# Patient Record
Sex: Male | Born: 1995 | Race: White | Hispanic: No | Marital: Single | Smoking: Never smoker
Health system: Southern US, Community
[De-identification: ages and names within clinical notes are randomized; demographics above are authoritative.]

## PROBLEM LIST (undated history)

## (undated) ENCOUNTER — Emergency Department (HOSPITAL_COMMUNITY): Admission: EM | Payer: Self-pay | Source: Home / Self Care

---
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
## 2013-06-14 ENCOUNTER — Emergency Department (HOSPITAL_COMMUNITY)
Admission: EM | Admit: 2013-06-14 | Discharge: 2013-06-14 | Disposition: A | Discharge status: Home / Self Care | Payer: Medicaid Other | Attending: Emergency Medicine | Admitting: Emergency Medicine

## 2013-06-14 ENCOUNTER — Encounter (HOSPITAL_COMMUNITY): Payer: Self-pay | Admitting: Emergency Medicine

## 2013-06-14 DIAGNOSIS — IMO0002 Reserved for concepts with insufficient information to code with codable children: Secondary | ICD-10-CM

## 2013-06-14 DIAGNOSIS — L600 Ingrowing nail: Secondary | ICD-10-CM

## 2013-06-14 DIAGNOSIS — L03039 Cellulitis of unspecified toe: Secondary | ICD-10-CM | POA: Insufficient documentation

## 2013-06-14 MED ORDER — OXYCODONE-ACETAMINOPHEN 5-325 MG PO TABS: 1.0000 | ORAL_TABLET | ORAL | Status: DC | PRN

## 2013-06-14 MED ORDER — IBUPROFEN 600 MG PO TABS: 600.0000 mg | ORAL_TABLET | Freq: Four times a day (QID) | ORAL | Status: AC | PRN

## 2013-06-14 MED ORDER — SULFAMETHOXAZOLE-TRIMETHOPRIM 800-160 MG PO TABS: 1.0000 | ORAL_TABLET | Freq: Two times a day (BID) | ORAL | Status: DC

## 2013-06-14 NOTE — ED Provider Notes (Signed)
CSN: [id number]     Arrival date & time 06/14/13  1523 History   First MD Initiated Contact with Patient 06/14/13 1527     Chief Complaint  Patient presents with  . Nail Problem     (Consider location/radiation/quality/duration/timing/severity/associated sxs/prior Treatment) HPI  17yM with pain in R great toe. Ingrown toenail. Has been picking at it. Increasing pain, redness and small amount of pus draining. No fever or chills. Not a diabetic. No numbness or tingling.   History reviewed. No pertinent past medical history. History reviewed. No pertinent past surgical history. No family history on file. History  Substance Use Topics  . Smoking status: Never Smoker   . Smokeless tobacco: Not on file  . Alcohol Use: No    Review of Systems  All systems reviewed and negative, other than as noted in HPI.   Allergies  Review of patient's allergies indicates no known allergies.  Home Medications   Prior to Admission medications   Medication Sig Start Date End Date Taking? Authorizing Provider  acetaminophen (TYLENOL) 325 MG tablet Take 325 mg by mouth every 6 (six) hours as needed for mild pain.   Yes Historical Provider, MD  ibuprofen (ADVIL,MOTRIN) 600 MG tablet Take 1 tablet (600 mg total) by mouth every 6 (six) hours as needed. 06/14/13   Raeford Razor, MD  oxyCODONE-acetaminophen (PERCOCET/ROXICET) 5-325 MG per tablet Take 1-2 tablets by mouth every 4 (four) hours as needed for severe pain. 06/14/13   Raeford Razor, MD  sulfamethoxazole-trimethoprim (SEPTRA DS) 800-160 MG per tablet Take 1 tablet by mouth every 12 (twelve) hours. 06/14/13   Raeford Razor, MD   BP 122/69  Pulse 64  Temp(Src) 97.9 F (36.6 C) (Oral)  Resp 19  SpO2 99% Physical Exam  Nursing note and vitals reviewed. Constitutional: He appears well-developed and well-nourished. No distress.  HENT:  Head: Normocephalic and atraumatic.  Eyes: Conjunctivae are normal. Right eye exhibits no discharge. Left eye  exhibits no discharge.  Neck: Neck supple.  Cardiovascular: Normal rate, regular rhythm and normal heart sounds.  Exam reveals no gallop and no friction rub.   No murmur heard. Pulmonary/Chest: Effort normal and breath sounds normal. No respiratory distress.  Abdominal: Soft. He exhibits no distension. There is no tenderness.  Musculoskeletal:  R big toe with ingrown nail medial side. Paronychia.   Neurological: He is alert.  Skin: Skin is warm and dry.  Psychiatric: He has a normal mood and affect. His behavior is normal. Thought content normal.    ED Course  Procedures (including critical care time) Labs Review Labs Reviewed - No data to display  Imaging Review No results found.   EKG Interpretation None      MDM   Final diagnoses:  Ingrown right big toenail  Paronychia    17yM with R big toe pain from ingrown toenail/paronychia. Wedge resection of medial toenail/drained. Continued care/return precautions discussed.     Raeford Razor, MD 06/14/13 [phone]

## 2013-06-14 NOTE — Discharge Instructions (Signed)
Paronychia Paronychia is an inflammatory reaction involving the folds of the skin surrounding the fingernail or toenail. This is commonly caused by an infection in the skin around a nail. The most common cause of paronychia is frequent wetting of the hands (as seen with bartenders, food servers, nurses or others who wet their hands). This makes the skin around the fingernail susceptible to infection by bacteria (germs) or fungus. Other predisposing factors are:  Aggressive manicuring. Ingrown Toenail An ingrown toenail occurs when the sharp edge of your toenail grows into the skin. Causes of ingrown toenails include toenails clipped too far back or poorly fitting shoes. Activities involving sudden stops (basketball, tennis) causing "toe jamming" may lead to an ingrown nail. HOME CARE INSTRUCTIONS  Soak the whole foot in warm soapy water for 20 minutes, 3 times per day. You may lift the edge of the nail away from the sore skin by wedging a small piece of cotton under the corner of the nail. Be careful not to dig (traumatize) and cause more injury to the area. Wear shoes that fit well. While the ingrown nail is causing problems, sandals may be beneficial. Trim your toenails regularly and carefully. Cut your toenails straight across, not in a curve. This will prevent injury to the skin at the corners of the toenail. Keep your feet clean and dry. Crutches may be helpful early in treatment if walking is painful. Antibiotics, if prescribed, should be taken as directed. Return for a wound check in 2 days or as directed. Only take over-the-counter or prescription medicines for pain, discomfort, or fever as directed by your caregiver. SEEK IMMEDIATE MEDICAL CARE IF:  You have a fever. You have increasing pain, redness, swelling, or heat at the wound site. Your toe is not better in 7 days. If conservative treatment is not successful, surgical removal of a portion or all of the nail may be necessary. MAKE  SURE YOU:  Understand these instructions. Will watch your condition. Will get help right away if you are not doing well or get worse. Document Released: 12/21/1999 Document Revised: 03/17/2011 Document Reviewed: 12/15/2007 [facility] Patient Information 2014 [place], [place].   Nail biting.  Thumb sucking. The most common cause is a staphylococcal (a type of germ) infection, or a fungal (Candida) infection. When caused by a germ, it usually comes on suddenly with redness, swelling, pus and is often painful. It may get under the nail and form an abscess (collection of pus), or form an abscess around the nail. If the nail itself is infected with a fungus, the treatment is usually prolonged and may require oral medicine for up to one year. Your caregiver will determine the length of time treatment is required. The paronychia caused by bacteria (germs) may largely be avoided by not pulling on hangnails or picking at cuticles. When the infection occurs at the tips of the finger it is called felon. When the cause of paronychia is from the herpes simplex virus (HSV) it is called herpetic whitlow. TREATMENT  When an abscess is present treatment is often incision and drainage. This means that the abscess must be cut open so the pus can get out. When this is done, the following home care instructions should be followed. HOME CARE INSTRUCTIONS   It is important to keep the affected fingers very dry. Rubber or plastic gloves over cotton gloves should be used whenever the hand must be placed in water.  Keep wound clean, dry and dressed as suggested by your caregiver between  warm soaks or warm compresses.  Soak in warm water for fifteen to twenty minutes three to four times per day for bacterial infections. Fungal infections are very difficult to treat, so often require treatment for long periods of time.  For bacterial (germ) infections take antibiotics (medicine which kill germs) as directed and finish the  prescription, even if the problem appears to be solved before the medicine is gone.  Only take over-the-counter or prescription medicines for pain, discomfort, or fever as directed by your caregiver. SEEK IMMEDIATE MEDICAL CARE IF:  You have redness, swelling, or increasing pain in the wound.  You notice pus coming from the wound.  You have a fever.  You notice a bad smell coming from the wound or dressing. Document Released: 06/18/2000 Document Revised: 03/17/2011 Document Reviewed: 02/18/2008 [facility] Patient Information 2014 [place], [place].

## 2013-06-14 NOTE — ED Notes (Signed)
Pt states he had ingrown toe nail to right great toe. Pt toe is red and painful.

## 2020-03-27 ENCOUNTER — Emergency Department (HOSPITAL_COMMUNITY)
Admission: EM | Admit: 2020-03-27 | Discharge: 2020-03-27 | Disposition: A | Discharge status: Home / Self Care | Payer: Self-pay | Attending: Emergency Medicine | Admitting: Emergency Medicine

## 2020-03-27 ENCOUNTER — Emergency Department (HOSPITAL_COMMUNITY): Payer: Self-pay

## 2020-03-27 DIAGNOSIS — S6010XA Contusion of unspecified finger with damage to nail, initial encounter: Secondary | ICD-10-CM

## 2020-03-27 DIAGNOSIS — S62663A Nondisplaced fracture of distal phalanx of left middle finger, initial encounter for closed fracture: Secondary | ICD-10-CM

## 2020-03-27 DIAGNOSIS — W230XXA Caught, crushed, jammed, or pinched between moving objects, initial encounter: Secondary | ICD-10-CM | POA: Insufficient documentation

## 2020-03-27 DIAGNOSIS — S62633A Displaced fracture of distal phalanx of left middle finger, initial encounter for closed fracture: Secondary | ICD-10-CM | POA: Insufficient documentation

## 2020-03-27 DIAGNOSIS — Y92810 Car as the place of occurrence of the external cause: Secondary | ICD-10-CM | POA: Insufficient documentation

## 2020-03-27 DIAGNOSIS — Z23 Encounter for immunization: Secondary | ICD-10-CM | POA: Insufficient documentation

## 2020-03-27 MED ORDER — NAPROXEN 250 MG PO TABS
500.0000 mg | ORAL_TABLET | Freq: Once | ORAL | Status: AC
  Administered 2020-03-27: 500 mg via ORAL
  Filled 2020-03-27: qty 2

## 2020-03-27 MED ORDER — TETANUS-DIPHTH-ACELL PERTUSSIS 5-2.5-18.5 LF-MCG/0.5 IM SUSY
0.5000 mL | PREFILLED_SYRINGE | Freq: Once | INTRAMUSCULAR | Status: AC
  Administered 2020-03-27: 0.5 mL via INTRAMUSCULAR
  Filled 2020-03-27: qty 0.5

## 2020-03-27 MED ORDER — OXYCODONE-ACETAMINOPHEN 5-325 MG PO TABS
1.0000 | ORAL_TABLET | Freq: Once | ORAL | Status: AC
  Administered 2020-03-27: 1 via ORAL
  Filled 2020-03-27: qty 1

## 2020-03-27 NOTE — ED Provider Notes (Signed)
[facility] EMERGENCY DEPARTMENT Provider Note   CSN: [id number] Arrival date & time: 03/27/20  1114     History Chief Complaint  Patient presents with  . Hand Injury    Ryan Hayden is a 25 y.o. male presents to the ED for evaluation of left middle finger injury that occurred yesterday while he was working on his car.  He was removing a tire and his fingertips got stuck behind the tire as he was attaching it. He was able to quickly move his finger out of the way. There was no sustained crush injury.  He has local throbbing pain at the finger tip.  States he has a small wound that has persistently bled through the night.  States is actually stopped here in the waiting room.  There are some purple discoloration underneath his finger nail.  Pain is worse with palpation.  He is left-handed.  Denies significant tingling or loss of sensation at the fingertip.  Unknown last tetanus status.  No other injuries.  HPI     No past medical history on file.  There are no problems to display for this patient.   No past surgical history on file.     No family history on file.  Social History   Tobacco Use  . Smoking status: Never Smoker  Substance Use Topics  . Alcohol use: No    Home Medications Prior to Admission medications   Medication Sig Start Date End Date Taking? Authorizing Provider  acetaminophen (TYLENOL) 325 MG tablet Take 325 mg by mouth every 6 (six) hours as needed for mild pain.    [provider]  ibuprofen (ADVIL,MOTRIN) 600 MG tablet Take 1 tablet (600 mg total) by mouth every 6 (six) hours as needed. 06/14/13   Raeford Razor, MD  oxyCODONE-acetaminophen (PERCOCET/ROXICET) 5-325 MG per tablet Take 1-2 tablets by mouth every 4 (four) hours as needed for severe pain. 06/14/13   Raeford Razor, MD  sulfamethoxazole-trimethoprim (SEPTRA DS) 800-160 MG per tablet Take 1 tablet by mouth every 12 (twelve) hours. 06/14/13   Raeford Razor, MD     Allergies    Patient has no known allergies.  Review of Systems   Review of Systems  Musculoskeletal: Positive for arthralgias.  Skin: Positive for wound.  All other systems reviewed and are negative.   Physical Exam Updated Vital Signs BP 118/68 (BP Location: Right Arm)   Pulse 79   Temp 97.8 F (36.6 C) (Oral)   Resp 20   SpO2 99%   Physical Exam Constitutional:      Appearance: He is well-developed.  HENT:     Head: Normocephalic.     Nose: Nose normal.  Eyes:     General: Lids are normal.  Cardiovascular:     Rate and Rhythm: Normal rate.     Comments: brisk cap refill left middle finger PIP Pulmonary:     Effort: Pulmonary effort is normal. No respiratory distress.  Musculoskeletal:        General: Normal range of motion.     Cervical back: Normal range of motion.     Comments: Focal tenderness secondary distal end of the left middle distal phalanx.  There is mild local edema here.  No focal bony tenderness of the IP joints.  Full range of motion at the IP joints.  Skin:    Comments: Penny straight laceration/abrasion at the very tip of the left middle finger right underneath the tip of the fingernail.  Hemostatic.  Less than 50% of the fingernail has a subungual hematoma.  See photo.  Nail folds and cuticle intact.  No instability of the fingernail.  Neurological:     Mental Status: He is alert.     Comments: Sensation to light touch intact in the left middle fingertip  Psychiatric:        Behavior: Behavior normal.         ED Results / Procedures / Treatments   Labs (all labs ordered are listed, but only abnormal results are displayed) Labs Reviewed - No data to display  EKG None  Radiology DG Hand Complete Left  Result Date: 03/27/2020 CLINICAL DATA:  Left hand injury. EXAM: LEFT HAND - COMPLETE 3+ VIEW COMPARISON:  None. FINDINGS: Three view radiograph left hand demonstrates a minimally displaced fracture of the tuft of the left middle  finger distal phalanx with fracture fragments in grossly anatomic alignment. Normal overall alignment. No additional fracture or dislocation. Joint spaces are preserved. Soft tissues are unremarkable. IMPRESSION: Minimally displaced, aligned, tuft fracture of the distal phalanx of the middle finger Electronically Signed   By: Helyn Numbers MD   On: 03/27/2020 13:17    Procedures Procedures   Medications Ordered in ED Medications  oxyCODONE-acetaminophen (PERCOCET/ROXICET) 5-325 MG per tablet 1 tablet (has no administration in time range)  naproxen (NAPROSYN) tablet 500 mg (has no administration in time range)  Tdap (BOOSTRIX) injection 0.5 mL (has no administration in time range)    ED Course  I have reviewed the triage vital signs and the nursing notes.  Pertinent labs & imaging results that were available during my care of the patient were reviewed by me and considered in my medical decision making (see chart for details).  Clinical Course as of 03/27/20 1626  Tue Mar 27, 2020  1552 DG Hand Complete Left IMPRESSION: Minimally displaced, aligned, tuft fracture of the distal phalanx of the middle finger [CG]    Clinical Course User Index [CG] Liberty Handy, PA-C   MDM Rules/Calculators/A&P                          25 year old male with traumatic left middle finger distal phalanx injury more than 24 hours ago.  X-ray confirms minimally displaced tuft fracture of the distal phalanx.  Wound here was soaked and irrigated.  Dressing applied.  Wounds are very small, hemostatic and appears superficial.  I do not think this is an open fracture.  He has a subungual hematoma that is less than 20% secondary nail.  He states that this has not enlarged in the last 24 hours.  Will defer finger trephination of.  Tetanus was updated.  He was placed in a protective finger splint.  Percocet and naproxen given here.  Recommended elevation, splint as needed for protection, high-dose  ibuprofen/acetaminophen every 6 hours.  No indication for antibiotics at this time, he was instructed on symptoms that would warrant return to the ED that would suggest an infection.  Understands that subungual hematoma could progress and he may need it drainage.  Appropriate for discharge.  Patient agreement with ER plan of care.  Final Clinical Impression(s) / ED Diagnoses Final diagnoses:  Nondisplaced fracture of distal phalanx of left middle finger, initial encounter for closed fracture  Subungual hematoma of finger, initial encounter    Rx / DC Orders ED Discharge Orders    None       Liberty Handy, PA-C 03/27/20 1626  Vanetta Mulders, MD 03/31/20 [phone]

## 2020-03-27 NOTE — ED Triage Notes (Signed)
C/O left finger injury last night while working on his car.

## 2020-03-27 NOTE — Discharge Instructions (Signed)
You were seen in the emergency department for left middle fingertip injury  X-ray showed a very small fracture at the distal phalanx.  This fracture will heal on its own.  Wear your finger splint for protection.  You may remove this as needed for bathing and to clean the wound.  You can use the splint as needed and until the pain improves.  Elevate your finger.  Take 600 mg of ibuprofen +650 to 1000 mg of acetaminophen every 6 hours for the next 48 hours to help with acute pain.  You can then use as needed.  There is a chance that the wounds can become infected.  Return to the ED for increased pain redness warmth pus fevers.  You have a bruise under your fingernail.  It does not appear that this needs to be drained today.  Return to the ED if there is increased purple discoloration under your fingernail, fingernail elevation or worsening pain.  Follow-up with clinic in the ED health and wellness clinic for recheck as needed

## 2021-02-22 ENCOUNTER — Ambulatory Visit (HOSPITAL_COMMUNITY)
Admission: EM | Admit: 2021-02-22 | Discharge: 2021-02-22 | Disposition: A | Discharge status: Home / Self Care | Payer: No Payment, Other | Attending: Psychiatry | Admitting: Psychiatry

## 2021-02-22 DIAGNOSIS — Z008 Encounter for other general examination: Secondary | ICD-10-CM

## 2021-02-22 DIAGNOSIS — Z0389 Encounter for observation for other suspected diseases and conditions ruled out: Secondary | ICD-10-CM

## 2021-02-22 DIAGNOSIS — Z046 Encounter for general psychiatric examination, requested by authority: Secondary | ICD-10-CM | POA: Insufficient documentation

## 2021-02-22 NOTE — ED Triage Notes (Signed)
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 Pt states that he got on probation in [place] and it is a requirement to have mental health evaluation in [place] for anyone on probation, so his probation officer in [place] told him that he needed to be seen so that he can complete all requirements for his probation. Pt states that he does not have any psych history and does not take any medication. Denies any drug use. Pt denies SI/HI and AVH. Pt is routine.

## 2021-02-22 NOTE — ED Provider Notes (Signed)
Behavioral Health Urgent Care Medical Screening Exam  Patient Name: Ryan Hayden MRN: [id number] Date of Evaluation: 02/22/21 Chief Complaint:   Diagnosis:  Final diagnoses:  Evaluation by psychiatric service required  No psychiatric disorder found after evaluation    History of Present illness: Ryan Hayden is a 26 y.o. male. with no past medical and psychiatric history presented to [facility] behavioral health urgent care for psych clearance for his probation. Patient states he is on probation in [place] for marijuana possession which he wants to transfer here in [place]. He states one of the requirement is to get psych clearance from a provider.  He denies any depression, or anxiety.  He denies any active or passive suicidal ideations, homicidal ideations, auditory and visual hallucinations.  He does not have any psychiatric diagnoses.  He denies any previous psychiatric hospitalization or suicidal attempts.  He is not on any psychotropic medications.  He denies use of any illicit drugs.  He states he used to smoke marijuana in the past.  He drinks alcohol occasionally.  He states he had family charges and charges for burning personal property 10 years ago in [place]. He states he was charged for that because he was there.  He lives with roommate in [place].  His mom and older brother lives in [place].  He gives consent to talk to mom and older brother for collateral. On examination, patient is alert and oriented x4.  Patient's speech is normal rate and volume.  Patient mood is euthymic with full range affect.  Patient's thought process is coherent.  Denies SI, HI, AVH.  Collateral from mom Ryan Hayden @321 - confirms that patient does not have any psychiatric illnesses or history. Mom states patient never tried to harm himself or others.  Mom states patient does not take any psychotropic medications.  Mom states patient has never been admitted for psychiatric  reasons.  Mom confirms that patient does not use any illicit drugs.  Mom does not have any concerns about his son.  Psychiatric Specialty Exam  Presentation  General Appearance:Appropriate for Environment  Eye Contact:Fair  Speech:Clear and Coherent; Normal Rate  Speech Volume:Normal  Handedness:No data recorded  Mood and Affect  Mood:Euthymic  Affect:Appropriate; Full Range   Thought Process  Thought Processes:Coherent  Descriptions of Associations:Intact  Orientation:Full (Time, Place and Person)  Thought Content:Logical; WDL    Hallucinations:None  Ideas of Reference:None  Suicidal Thoughts:No  Homicidal Thoughts:No   Sensorium  Memory:Immediate Good; Recent Good; Remote Good  Judgment:Fair  Insight:Fair   Executive Functions  Concentration:Good  Attention Span:Good  Recall:Good  Fund of Knowledge:Good  Language:Good   Psychomotor Activity  Psychomotor Activity:Normal   Assets  Assets:Communication Skills; Housing; Physical Health; Social Support   Sleep  Sleep:Good  Number of hours: No data recorded  No data recorded  Physical Exam: Physical Exam Vitals and nursing note reviewed.  Constitutional:      General: He is not in acute distress.    Appearance: Normal appearance. He is not ill-appearing, toxic-appearing or diaphoretic.  Pulmonary:     Effort: Pulmonary effort is normal.  Neurological:     General: No focal deficit present.     Mental Status: He is alert and oriented to person, place, and time.   Review of Systems  Psychiatric/Behavioral:  Negative for depression, hallucinations, substance abuse and suicidal ideas. The patient is not nervous/anxious.   Blood pressure 115/68, pulse 85, temperature 98.3 F (36.8 C), temperature source Oral, resp. rate 18, SpO2 97 %. There is  no height or weight on file to calculate BMI.  Musculoskeletal: Strength & Muscle Tone: within normal limits Gait & Station: normal Patient  [place]: N/A   [facility] MSE Discharge Disposition for Follow up and Recommendations: Patient denies SI, HI, AVH.  - Pt is psych cleared.   Karsten Ro, MD 02/22/2021, 3:57 PM

## 2021-02-22 NOTE — Discharge Instructions (Addendum)
Patient is Psych cleared.

## 2021-02-22 NOTE — ED Notes (Signed)
Pt discharged with  AVS.  AVS reviewed prior to discharge.  Pt alert, oriented, and ambulatory.  Safety maintained.  °

## 2022-05-28 ENCOUNTER — Encounter: Payer: Self-pay | Admitting: Family Medicine

## 2022-05-28 ENCOUNTER — Ambulatory Visit (INDEPENDENT_AMBULATORY_CARE_PROVIDER_SITE_OTHER): Payer: Self-pay | Admitting: Family Medicine

## 2022-05-28 VITALS — BP 122/76 | HR 89 | Temp 98.1°F | Resp 18 | Ht 72.0 in | Wt 198.0 lb

## 2022-05-28 DIAGNOSIS — N5089 Other specified disorders of the male genital organs: Secondary | ICD-10-CM

## 2022-05-28 DIAGNOSIS — Z136 Encounter for screening for cardiovascular disorders: Secondary | ICD-10-CM

## 2022-05-28 DIAGNOSIS — Z0000 Encounter for general adult medical examination without abnormal findings: Secondary | ICD-10-CM

## 2022-05-28 DIAGNOSIS — Z1159 Encounter for screening for other viral diseases: Secondary | ICD-10-CM

## 2022-05-28 DIAGNOSIS — Z7689 Persons encountering health services in other specified circumstances: Secondary | ICD-10-CM

## 2022-05-28 DIAGNOSIS — R7302 Impaired glucose tolerance (oral): Secondary | ICD-10-CM

## 2022-05-28 NOTE — Progress Notes (Signed)
New Patient Office Visit  Subjective    Patient ID: Ryan Hayden, male    DOB: 1995/05/24  Age: 27 y.o. MRN: [id number]  CC:  Chief Complaint  Patient presents with   Establish Care    HPI Ryan Hayden presents to establish care. Pt is here with girlfriend.  He would like physical and labs. He would like STD screening. He has had right sided testicle pain. He discovered a lump last week. He denies change in size.  He hasn't eaten today. He does vape nicotine. No allergies.   Flowsheet Row Office Visit from 05/28/2022 in [facility]  PHQ-9 Total Score 3          05/28/2022    3:00 PM  GAD 7 : Generalized Anxiety Score  Nervous, Anxious, on Edge 1  Control/stop worrying 1  Worry too much - different things 1  Trouble relaxing 1  Restless 0  Easily annoyed or irritable 1  Afraid - awful might happen 0  Total GAD 7 Score 5  Anxiety Difficulty Somewhat difficult      Outpatient Encounter Medications as of 05/28/2022  Medication Sig   acetaminophen (TYLENOL) 325 MG tablet Take 325 mg by mouth every 6 (six) hours as needed for mild pain.   ibuprofen (ADVIL,MOTRIN) 600 MG tablet Take 1 tablet (600 mg total) by mouth every 6 (six) hours as needed.   Melatonin 1 MG CHEW Chew by mouth.   No facility-administered encounter medications on file as of 05/28/2022.    History reviewed. No pertinent past medical history.  History reviewed. No pertinent surgical history.  Family History  Problem Relation Age of Onset   Retinitis pigmentosa Mother     Social History   Socioeconomic History   Marital status: Single    Spouse name: Not on file   Number of children: Not on file   Years of education: Not on file   Highest education level: Not on file  Occupational History   Not on file  Tobacco Use   Smoking status: Never    Passive exposure: Current   Smokeless tobacco: Never  Vaping Use   Vaping Use: Every day  Substance and  Sexual Activity   Alcohol use: Not Currently   Drug use: Yes    Types: Marijuana    Comment: Smokes occassionally   Sexual activity: Not on file  Other Topics Concern   Not on file  Social History Narrative   ** Merged History Encounter **       ** Merged History Encounter **       Social Determinants of Health   Financial Resource Strain: Not on file  Food Insecurity: Not on file  Transportation Needs: Not on file  Physical Activity: Not on file  Stress: Not on file  Social Connections: Not on file  Intimate Partner Violence: Not on file    Review of Systems  Genitourinary:        Testicular lump  All other systems reviewed and are negative.      Objective    BP 122/76   Pulse 89   Temp 98.1 F (36.7 C) (Oral)   Resp 18   Ht 6' (1.829 m)   Wt 198 lb (89.8 kg)   SpO2 96%   BMI 26.85 kg/m   Physical Exam Vitals and nursing note reviewed.  Constitutional:      Appearance: Normal appearance. He is normal weight.  HENT:  Head: Normocephalic and atraumatic.     Right Ear: Tympanic membrane, ear canal and external ear normal.     Left Ear: Tympanic membrane, ear canal and external ear normal.     Nose: Nose normal.     Mouth/Throat:     Mouth: Mucous membranes are moist.  Eyes:     Pupils: Pupils are equal, round, and reactive to light.  Cardiovascular:     Rate and Rhythm: Normal rate and regular rhythm.     Pulses: Normal pulses.     Heart sounds: Normal heart sounds.  Pulmonary:     Effort: Pulmonary effort is normal.     Breath sounds: Normal breath sounds.  Abdominal:     General: Abdomen is flat.  Skin:    General: Skin is warm.     Capillary Refill: Capillary refill takes less than 2 seconds.  Neurological:     General: No focal deficit present.     Mental Status: He is alert and oriented to person, place, and time. Mental status is at baseline.  Psychiatric:        Mood and Affect: Mood normal.        Behavior: Behavior normal.         Thought Content: Thought content normal.        Judgment: Judgment normal.       Assessment & Plan:   Problem List Items Addressed This Visit   None  Annual physical exam  Encounter to establish care with new doctor  Encounter for lipid screening for cardiovascular disease -     Lipid panel  Impaired glucose tolerance -     CBC with Differential/Platelet -     Comprehensive metabolic panel -     Hemoglobin A1c  Screening for viral disease -     Hepatitis C antibody -     HIV Antibody (routine testing w rflx) -     Chlamydia/Gonococcus/Trichomonas, NAA  Testicular lump -     TSH -     T4, free -     Testosterone, Free, Total, SHBG -     US SCROTUM W/DOPPLER; Future  Return for fasting labs including testosterone Due to testicular lump, send for ultrasound. Advised to try Aleve otc po bid prn.  Follow up pending results. No follow-ups on file.   Suzan Slick, MD

## 2022-05-30 LAB — LIPID PANEL
Chol/HDL Ratio: 5.6 ratio — ABNORMAL HIGH (ref 0.0–5.0)
Cholesterol, Total: 175 mg/dL (ref 100–199)
HDL: 31 mg/dL — ABNORMAL LOW (ref >39)
LDL Chol Calc (NIH): 124 mg/dL — ABNORMAL HIGH (ref 0–99)
Triglycerides: 110 mg/dL (ref 0–149)
VLDL Cholesterol Cal: 20 mg/dL (ref 5–40)

## 2022-05-30 LAB — CBC WITH DIFFERENTIAL/PLATELET
Basophils Absolute: 0.1 10*3/uL (ref 0.0–0.2)
Basos: 1 %
EOS (ABSOLUTE): 0.1 10*3/uL (ref 0.0–0.4)
Eos: 2 %
Hematocrit: 49.1 % (ref 37.5–51.0)
Hemoglobin: 17.2 g/dL (ref 13.0–17.7)
Immature Grans (Abs): 0 10*3/uL (ref 0.0–0.1)
Immature Granulocytes: 0 %
Lymphocytes Absolute: 2.1 10*3/uL (ref 0.7–3.1)
Lymphs: 27 %
MCH: 31.9 pg (ref 26.6–33.0)
MCHC: 35 g/dL (ref 31.5–35.7)
MCV: 91 fL (ref 79–97)
Monocytes Absolute: 0.4 10*3/uL (ref 0.1–0.9)
Monocytes: 5 %
Neutrophils Absolute: 5.2 10*3/uL (ref 1.4–7.0)
Neutrophils: 65 %
Platelets: 289 10*3/uL (ref 150–450)
RBC: 5.39 x10E6/uL (ref 4.14–5.80)
RDW: 12 % (ref 11.6–15.4)
WBC: 7.9 10*3/uL (ref 3.4–10.8)

## 2022-05-30 LAB — COMPREHENSIVE METABOLIC PANEL
ALT: 27 IU/L (ref 0–44)
AST: 29 IU/L (ref 0–40)
Albumin/Globulin Ratio: 2.2 (ref 1.2–2.2)
Albumin: 5 g/dL (ref 4.3–5.2)
Alkaline Phosphatase: 69 IU/L (ref 44–121)
BUN/Creatinine Ratio: 9 (ref 9–20)
BUN: 9 mg/dL (ref 6–20)
Bilirubin Total: 1.9 mg/dL — ABNORMAL HIGH (ref 0.0–1.2)
CO2: 19 mmol/L — ABNORMAL LOW (ref 20–29)
Calcium: 9.8 mg/dL (ref 8.7–10.2)
Chloride: 97 mmol/L (ref 96–106)
Creatinine, Ser: 1.01 mg/dL (ref 0.76–1.27)
Globulin, Total: 2.3 g/dL (ref 1.5–4.5)
Glucose: 73 mg/dL (ref 70–99)
Potassium: 3.9 mmol/L (ref 3.5–5.2)
Sodium: 139 mmol/L (ref 134–144)
Total Protein: 7.3 g/dL (ref 6.0–8.5)
eGFR: 104 mL/min/{1.73_m2} (ref >59)

## 2022-05-30 LAB — T4, FREE: Free T4: 1.99 ng/dL — ABNORMAL HIGH (ref 0.82–1.77)

## 2022-05-30 LAB — HEMOGLOBIN A1C
Est. average glucose Bld gHb Est-mCnc: 111 mg/dL
Hgb A1c MFr Bld: 5.5 % (ref 4.8–5.6)

## 2022-05-30 LAB — HIV ANTIBODY (ROUTINE TESTING W REFLEX): HIV Screen 4th Generation wRfx: NONREACTIVE

## 2022-05-30 LAB — HEPATITIS C ANTIBODY: Hep C Virus Ab: NONREACTIVE

## 2022-05-30 LAB — TSH: TSH: 2.23 u[IU]/mL (ref 0.450–4.500)

## 2022-06-04 ENCOUNTER — Ambulatory Visit (INDEPENDENT_AMBULATORY_CARE_PROVIDER_SITE_OTHER): Payer: Self-pay

## 2022-06-04 DIAGNOSIS — N5089 Other specified disorders of the male genital organs: Secondary | ICD-10-CM

## 2022-06-05 ENCOUNTER — Encounter: Payer: Self-pay | Admitting: Family Medicine

## 2022-06-05 DIAGNOSIS — L239 Allergic contact dermatitis, unspecified cause: Secondary | ICD-10-CM

## 2022-06-05 DIAGNOSIS — L729 Follicular cyst of the skin and subcutaneous tissue, unspecified: Secondary | ICD-10-CM

## 2022-06-05 DIAGNOSIS — N5089 Other specified disorders of the male genital organs: Secondary | ICD-10-CM

## 2022-06-16 MED ORDER — TRIAMCINOLONE ACETONIDE 0.5 % EX CREA
1.0000 | TOPICAL_CREAM | Freq: Two times a day (BID) | CUTANEOUS | 3 refills | Status: AC
Start: 2022-06-16 — End: ?

## 2022-12-03 IMAGING — DX DG HAND COMPLETE 3+V*L*
3 series · 3 of 3 positions shown · non-contrast
Comparison: None.

CLINICAL DATA: Left hand injury.

EXAM:
LEFT HAND - COMPLETE 3+ VIEW

[x hand pa left]
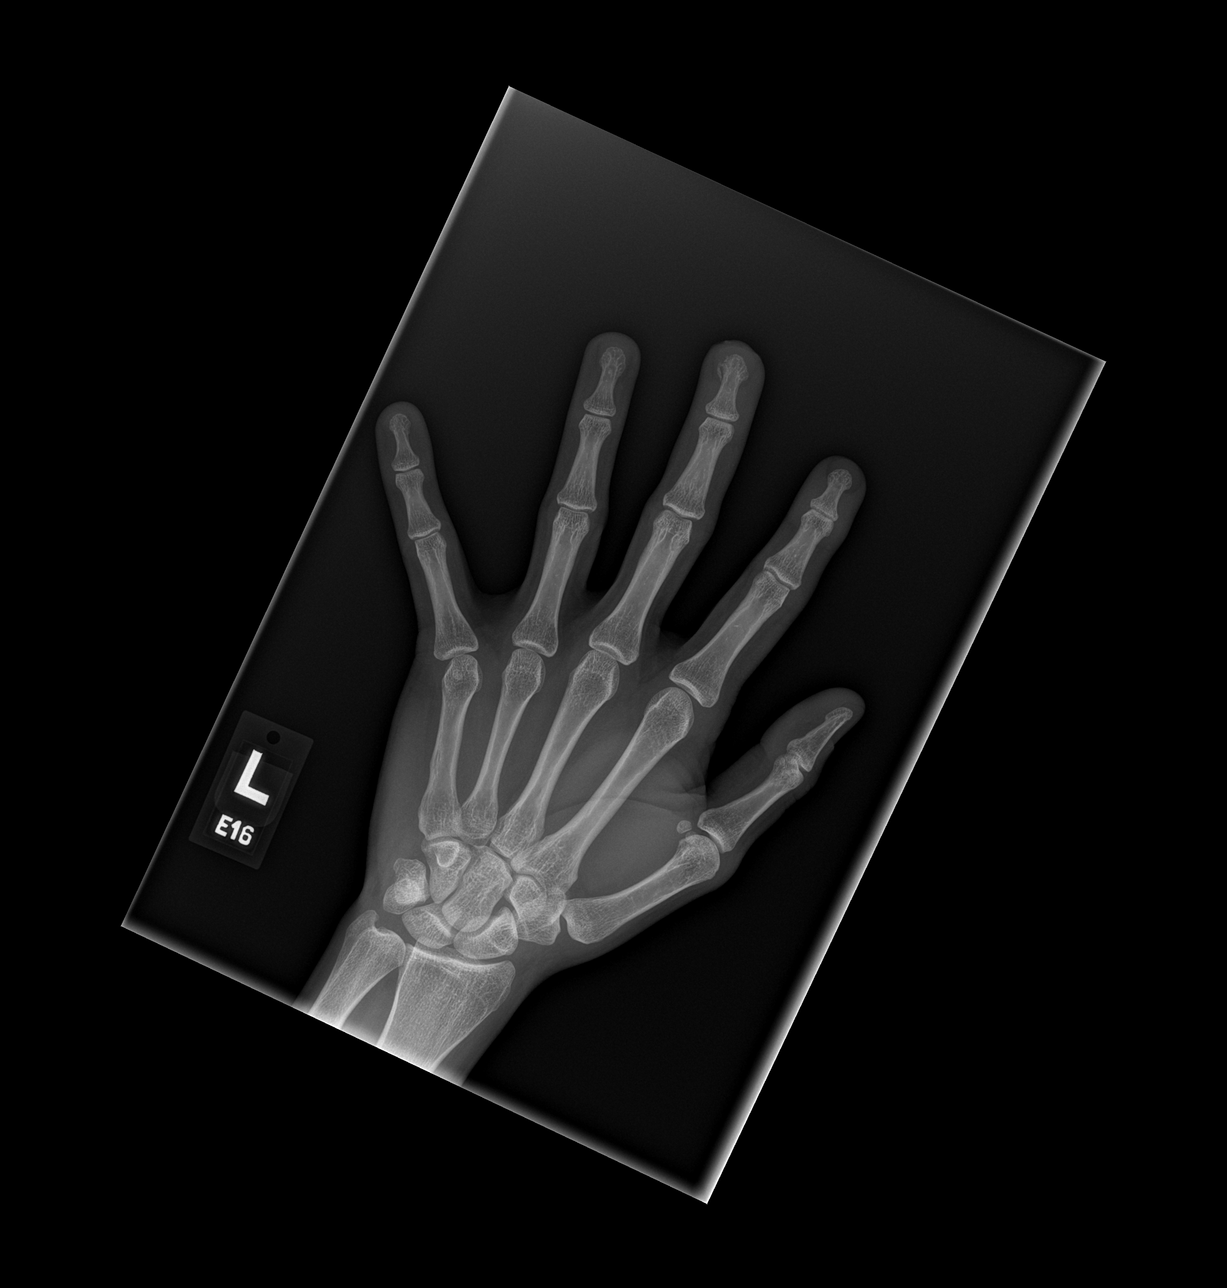

[x hand obl left]
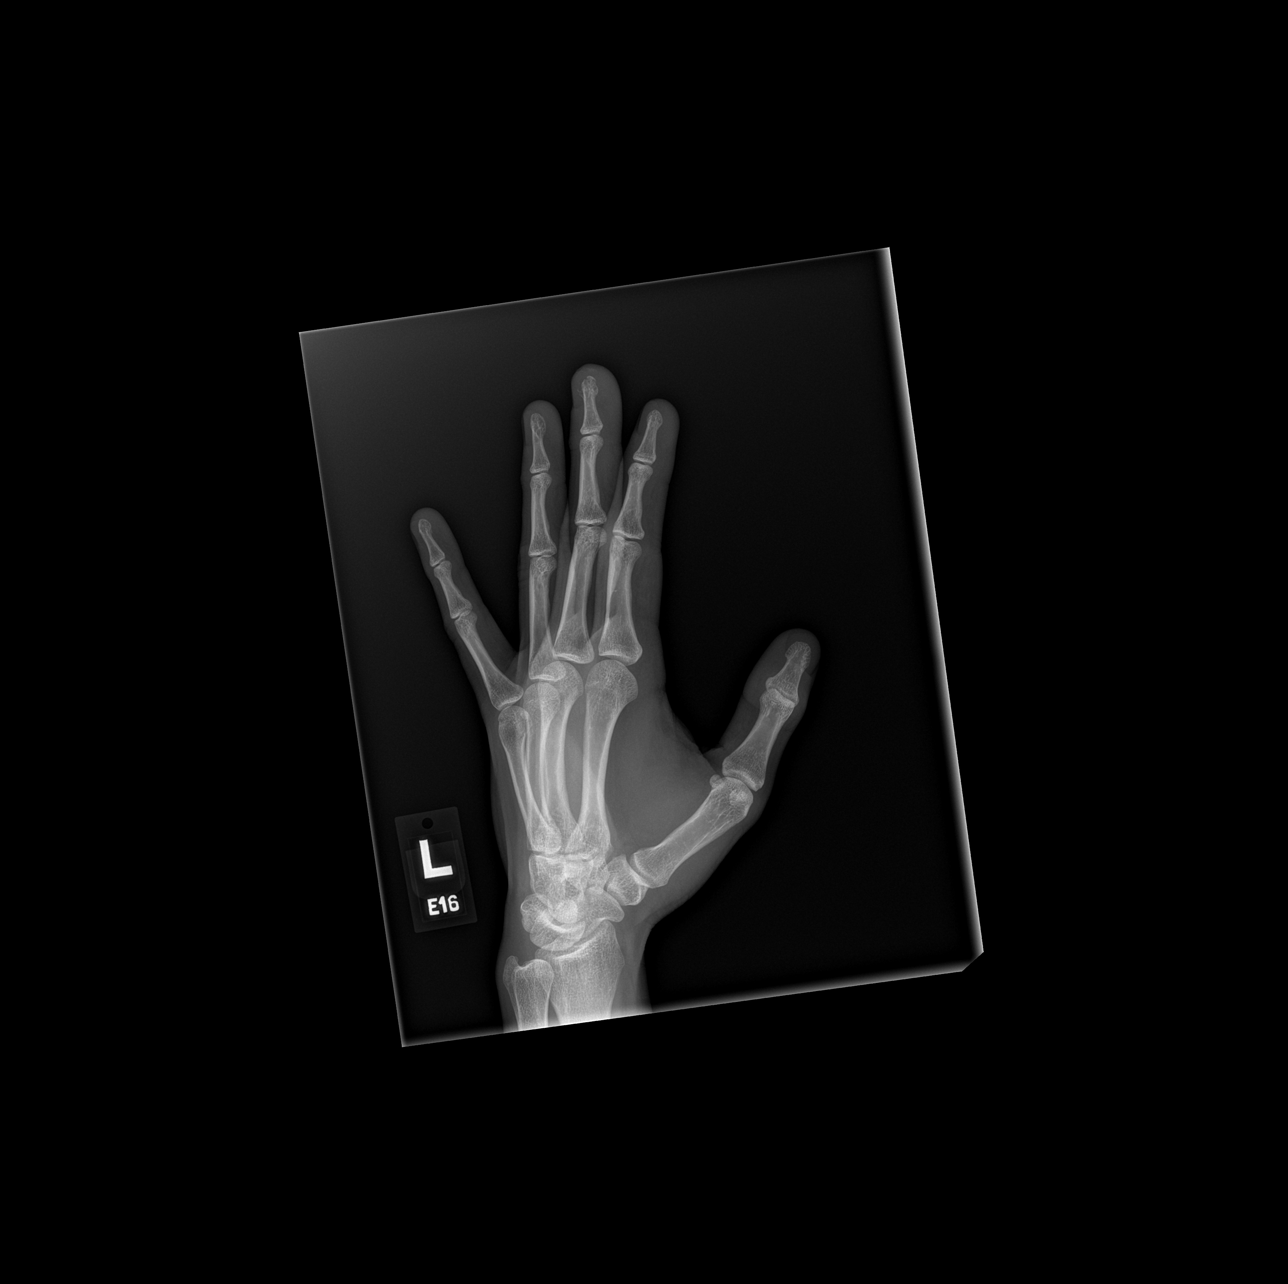

[x hand lat left]
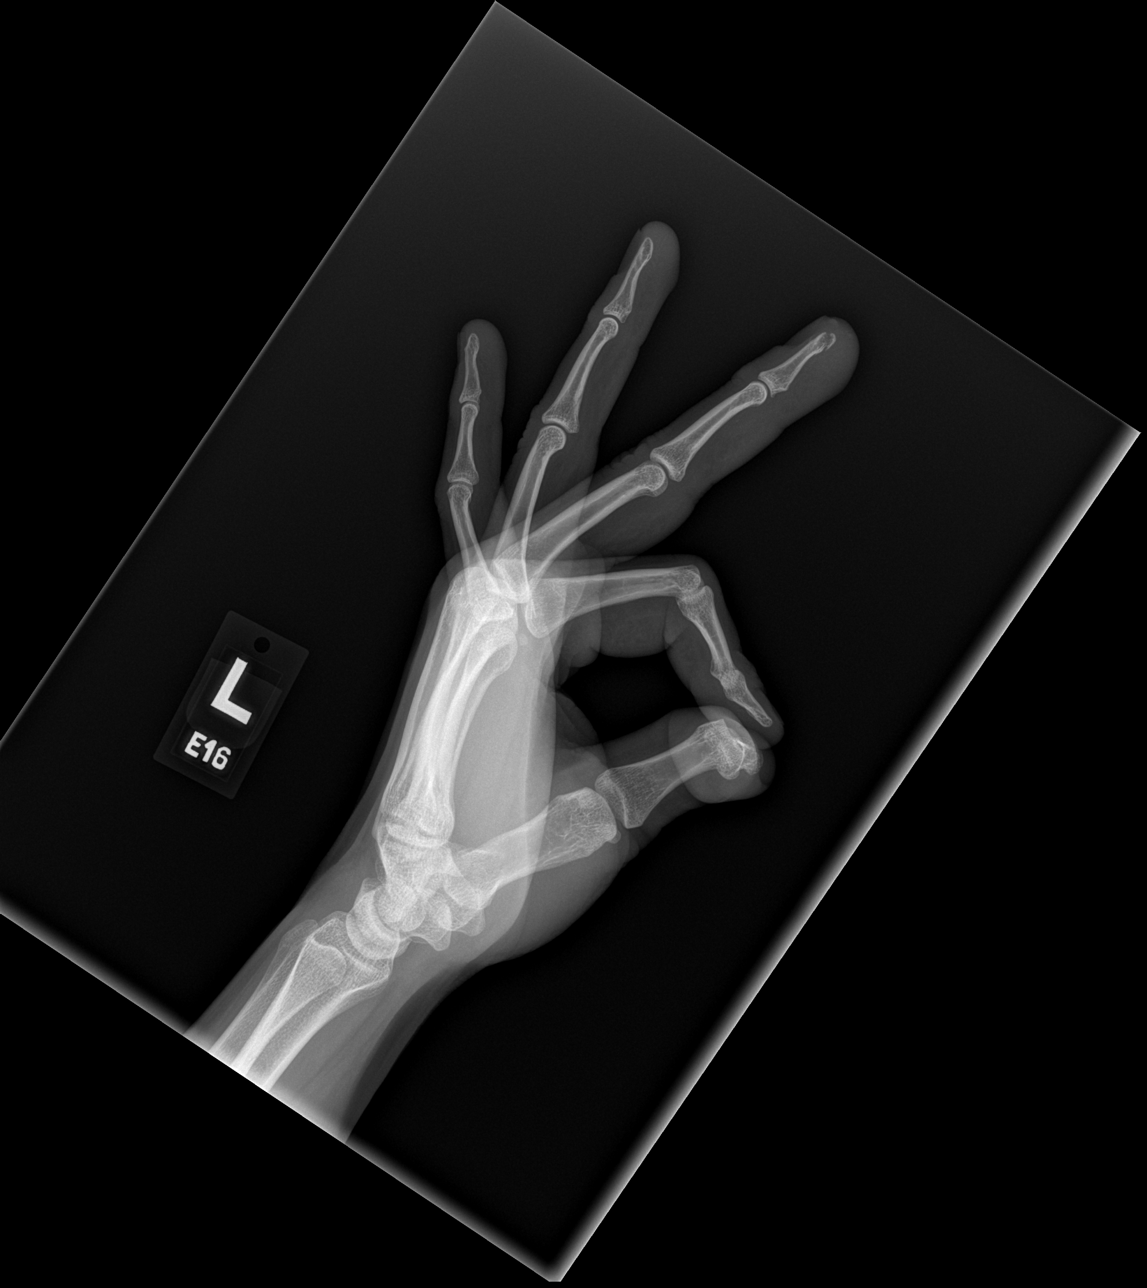

[3 of 3 positions shown; findings below may reference images not displayed]

FINDINGS: Three view radiograph left hand demonstrates a minimally displaced
fracture of the tuft of the left middle finger distal phalanx with
fracture fragments in grossly anatomic alignment. Normal overall
alignment. No additional fracture or dislocation. Joint spaces are
preserved. Soft tissues are unremarkable.
IMPRESSION: Minimally displaced, aligned, tuft fracture of the distal phalanx of
the middle finger

## 2023-04-29 ENCOUNTER — Emergency Department (HOSPITAL_BASED_OUTPATIENT_CLINIC_OR_DEPARTMENT_OTHER)
Admission: EM | Admit: 2023-04-29 | Discharge: 2023-04-29 | Disposition: A | Discharge status: Home / Self Care | Payer: Self-pay | Attending: Emergency Medicine | Admitting: Emergency Medicine

## 2023-04-29 ENCOUNTER — Emergency Department (HOSPITAL_BASED_OUTPATIENT_CLINIC_OR_DEPARTMENT_OTHER): Payer: Self-pay | Admitting: Radiology

## 2023-04-29 ENCOUNTER — Other Ambulatory Visit: Payer: Self-pay

## 2023-04-29 DIAGNOSIS — M25561 Pain in right knee: Secondary | ICD-10-CM | POA: Insufficient documentation

## 2023-04-29 NOTE — ED Triage Notes (Signed)
 Patient to ED with right sided knee pain x 1 week without known injury. Patient concerned for raised area on knee that is hard to the touch.

## 2023-04-29 NOTE — Discharge Instructions (Addendum)
 You were evaluated in the emergency room for knee pain.  Your exam was consistent with a cyst.  You are provided a referral for orthopedics.  Please call make an appointment at your earliest convenience.  You may alternate Tylenol  and ibuprofen  as needed for pain.

## 2023-04-29 NOTE — ED Provider Notes (Signed)
 [place] EMERGENCY DEPARTMENT AT [facility] Provider Note   CSN: [id number] Arrival date & time: 04/29/23  1652     History  Chief Complaint  Patient presents with   Knee Pain    Ryan Hayden is a 28 y.o. male otherwise healthy presents with right knee pain x 1 week.  No injury or trauma.  No prior surgeries to the extremity.  Describes pain to the lateral aspect of the knee where he a bump.   Knee Pain  No past medical history on file. No past surgical history on file.     Home Medications Prior to Admission medications   Medication Sig Start Date End Date Taking? Authorizing Provider  acetaminophen  (TYLENOL ) 325 MG tablet Take 325 mg by mouth every 6 (six) hours as needed for mild pain.    [provider]  ibuprofen  (ADVIL ,MOTRIN ) 600 MG tablet Take 1 tablet (600 mg total) by mouth every 6 (six) hours as needed. 06/14/13   Bart Born, MD  Melatonin 1 MG CHEW Chew by mouth.    [provider]  triamcinolone  cream (KENALOG ) 0.5 % Apply 1 Application topically 2 (two) times daily. To affected areas. 06/16/22   Manette Section, MD      Allergies    Patient has no known allergies.    Review of Systems   Review of Systems  Musculoskeletal:  Positive for myalgias.    Physical Exam Updated Vital Signs BP 125/69 (BP Location: Left Arm)   Pulse (!) 59   Temp 98.2 F (36.8 C) (Oral)   Resp 14   Ht 6\' 1"  (1.854 m)   Wt 72.6 kg   SpO2 100%   BMI 21.11 kg/m  Physical Exam Constitutional:      Appearance: Normal appearance.  HENT:     Head: Normocephalic and atraumatic.  Eyes:     Conjunctiva/sclera: Conjunctivae normal.  Cardiovascular:     Pulses: Normal pulses.  Pulmonary:     Effort: Pulmonary effort is normal. No respiratory distress.  Musculoskeletal:     Comments: Small 1 cm nodule on the lateral aspect of the right knee joint line.  Mobile, well-circumscribed, tender to palpation.  There is no overlying erythema, warmth  or fluctuance.  Tolerates full range of motion of the knee, 5 out of 5 strength, no generalized knee swelling.  PT pulses 2+  Skin:    General: Skin is warm and dry.  Neurological:     General: No focal deficit present.     Mental Status: He is alert and oriented to person, place, and time.     ED Results / Procedures / Treatments   Labs (all labs ordered are listed, but only abnormal results are displayed) Labs Reviewed - No data to display  EKG None  Radiology DG Knee Complete 4 Views Right Result Date: 04/29/2023 CLINICAL DATA:  Right knee pain EXAM: RIGHT KNEE - COMPLETE 4+ VIEW COMPARISON:  None Available. FINDINGS: No evidence of fracture, dislocation, or joint effusion. No evidence of arthropathy or other focal bone abnormality. Soft tissues are unremarkable. IMPRESSION: Negative. Electronically Signed   By: Fredrich Jefferson M.D.   On: 04/29/2023 18:10    Procedures Procedures    Medications Ordered in ED Medications - No data to display  ED Course/ Medical Decision Making/ A&P  Medical Decision Making Amount and/or Complexity of Data Reviewed Radiology: ordered.   This patient presents to the ED with chief complaint(s) of knee pain.  The complaint involves an extensive differential diagnosis and also carries with it a high risk of complications and morbidity.   Pertinent past medical history as listed in HPI  The differential diagnosis includes  Septic joint, gout, fracture, dislocation, sprain, Osgood schlatters Additional history obtained: Records reviewed Care Everywhere/External Records  Assessment and management:   Hemodynamically stable, nontoxic-appearing patient presenting with atraumatic right knee pain x 1 week.  On exam patient does have what appears to be a well-circumscribed, mobile and tender nodule on the lateral aspect of the right knee.  There is no overlying erythema, warmth or fluctuance.  Tolerates full range of  motion of the knee without discomfort.  No suspicion for septic joint, gout, cellulitis or abscess.  Otherwise there is no knee swelling, knee is stable to varus and valgus.  He is able to ambulate without difficulty.  POCUS demonstrates well-circumscribed fluid collection, 1 cm in diameter without surrounding cobblestoning or extension into the knee.  Overall consistent with simple cyst.  Knee x-ray unremarkable.  Will provide Ace bandage and refer to orthopedics.  Independent ECG interpretation:  none  Independent labs interpretation:  The following labs were independently interpreted:  none  Independent visualization and interpretation of imaging: I independently visualized the following imaging with scope of interpretation limited to determining acute life threatening conditions related to emergency care: Right knee x-ray negative for any acute abnormality   Consultations obtained:   none  Disposition:   Patient will be discharged home. The patient has been appropriately medically screened and/or stabilized in the ED. I have low suspicion for any other emergent medical condition which would require further screening, evaluation or treatment in the ED or require inpatient management. At time of discharge the patient is hemodynamically stable and in no acute distress. I have discussed work-up results and diagnosis with patient and answered all questions. Patient is agreeable with discharge plan. We discussed strict return precautions for returning to the emergency department and they verbalized understanding.     Social Determinants of Health:   none  This note was dictated with voice recognition software.  Despite best efforts at proofreading, errors may have occurred which can change the documentation meaning.          Final Clinical Impression(s) / ED Diagnoses Final diagnoses:  Acute pain of right knee    Rx / DC Orders ED Discharge Orders     None          Felicie Horning, PA-C 04/29/23 1817    Kingsley, Victoria K, DO 04/29/23 1948
